# Patient Record
Sex: Female | Born: 1995 | Race: Black or African American | Hispanic: No | Marital: Single | Smoking: Never smoker
Health system: Southern US, Community
[De-identification: ages and names within clinical notes are randomized; demographics above are authoritative.]

## PROBLEM LIST (undated history)

## (undated) DIAGNOSIS — J45909 Unspecified asthma, uncomplicated: Secondary | ICD-10-CM

---
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
## 2018-12-24 ENCOUNTER — Other Ambulatory Visit: Payer: Self-pay

## 2018-12-24 ENCOUNTER — Encounter (HOSPITAL_COMMUNITY): Payer: Self-pay

## 2018-12-24 ENCOUNTER — Ambulatory Visit (INDEPENDENT_AMBULATORY_CARE_PROVIDER_SITE_OTHER): Payer: Self-pay

## 2018-12-24 ENCOUNTER — Ambulatory Visit (HOSPITAL_COMMUNITY)
Admission: EM | Admit: 2018-12-24 | Discharge: 2018-12-24 | Disposition: A | Discharge status: Home / Self Care | Payer: Self-pay | Attending: Family Medicine | Admitting: Family Medicine

## 2018-12-24 DIAGNOSIS — K5900 Constipation, unspecified: Secondary | ICD-10-CM

## 2018-12-24 MED ORDER — POLYETHYLENE GLYCOL 3350 17 GM/SCOOP PO POWD: 17.0000 g | Freq: Every day | ORAL | 0 refills | Status: AC

## 2018-12-24 NOTE — ED Provider Notes (Signed)
[facility]    CSN: [id number] Arrival date & time: 12/24/18  1042      History   Chief Complaint Chief Complaint  Patient presents with  . Abdominal Pain    HPI Gail Rogers is a 23 y.o. female.   This is the initial visit for this 23 year old woman.  She complains of abdominal pain.  Is relatively constant but does vary in intensity.  It is associated with nausea but no vomiting.  Patient's had no fever.  Patient thinks he may be constipated.  She is going to the bathroom regularly but feels somewhat bloated.  Pain started about 4 days ago.     Past Medical History:  Diagnosis Date  . Asthma     There are no problems to display for this patient.   History reviewed. No pertinent surgical history.  OB History   No obstetric history on file.      Home Medications    Prior to Admission medications   Medication Sig Start Date End Date Taking? Authorizing Provider  ibuprofen (ADVIL,MOTRIN) 600 MG tablet Take 1 tablet (600 mg total) by mouth 3 (three) times daily. 04/07/13   Fransico Meadow, PA-C  OVER THE COUNTER MEDICATION Asthma pump    [provider]  polyethylene glycol powder (MIRALAX) 17 GM/SCOOP powder Take 17 g by mouth daily. 12/24/18   Robyn Haber, MD    Family History History reviewed. No pertinent family history.  Social History Social History   Tobacco Use  . Smoking status: Never Smoker  . Smokeless tobacco: Never Used  Substance Use Topics  . Alcohol use: No  . Drug use: No     Allergies   Patient has no known allergies.   Review of Systems Review of Systems  Gastrointestinal: Positive for abdominal pain and nausea.  All other systems reviewed and are negative.    Physical Exam Triage Vital Signs ED Triage Vitals  Enc Vitals Group     BP 12/24/18 1059 137/85     Pulse Rate 12/24/18 1059 85     Resp 12/24/18 1059 16     Temp 12/24/18 1059 98.5 F (36.9 C)     Temp Source 12/24/18 1059 Oral   SpO2 12/24/18 1059 99 %     Weight 12/24/18 1056 160 lb (72.6 kg)     Height --      Head Circumference --      Peak Flow --      Pain Score 12/24/18 1056 7     Pain Loc --      Pain Edu? --      Excl. in [place]? --    No data found.  Updated Vital Signs BP 137/85 (BP Location: Right Arm)   Pulse 85   Temp 98.5 F (36.9 C) (Oral)   Resp 16   Wt 72.6 kg   LMP 12/24/2018   SpO2 99%    Physical Exam Vitals and nursing note reviewed.  Constitutional:      Appearance: She is well-developed.  HENT:     Head: Normocephalic.  Eyes:     Extraocular Movements: Extraocular movements intact.  Pulmonary:     Effort: Pulmonary effort is normal.     Breath sounds: Normal breath sounds.  Abdominal:     General: Abdomen is flat. Bowel sounds are normal.     Palpations: Abdomen is soft.     Tenderness: There is no abdominal tenderness.  Skin:    General: Skin is  warm and dry.  Neurological:     General: No focal deficit present.     Mental Status: She is alert.  Psychiatric:        Mood and Affect: Mood normal.        Behavior: Behavior normal.      UC Treatments / Results  Labs (all labs ordered are listed, but only abnormal results are displayed) Labs Reviewed - No data to display  EKG 12 lead EKG shows nonspecific T wave changes in anterior leads.  Radiology DG Abd 1 View  Result Date: 12/24/2018 CLINICAL DATA:  Abdominal pain, constipation EXAM: ABDOMEN - 1 VIEW COMPARISON:  None. FINDINGS: Small amount of stool in the ascending colon. There is no bowel dilatation to suggest obstruction. There is no evidence of pneumoperitoneum, portal venous gas or pneumatosis. There are no pathologic calcifications along the expected course of the ureters. The osseous structures are unremarkable. IMPRESSION: There is a small amount of stool in the ascending colon. Electronically Signed   By: Elige Ko   On: 12/24/2018 12:41    Procedures Procedures (including critical care  time)  Medications Ordered in UC Medications - No data to display  Initial Impression / Assessment and Plan / UC Course  I have reviewed the triage vital signs and the nursing notes.  Pertinent labs & imaging results that were available during my care of the patient were reviewed by me and considered in my medical decision making (see chart for details).    Final Clinical Impressions(s) / UC Diagnoses   Final diagnoses:  Constipation, unspecified constipation type   Discharge Instructions   None    ED Prescriptions    Medication Sig Dispense Auth. Provider   polyethylene glycol powder (MIRALAX) 17 GM/SCOOP powder Take 17 g by mouth daily. 255 g Elvina Sidle, MD     I have reviewed the PDMP during this encounter.   Elvina Sidle, MD 12/24/18 1248

## 2018-12-24 NOTE — ED Triage Notes (Signed)
Pt states she has been having cramps and nausea. That have been very serve  for 3 or 4 days. Pt states it feels like gas .

## 2020-10-09 IMAGING — DX DG ABDOMEN 1V
1 series · 1 of 1 positions shown · non-contrast
Comparison: None.

CLINICAL DATA: Abdominal pain, constipation

EXAM:
ABDOMEN - 1 VIEW

[abdomen kub]
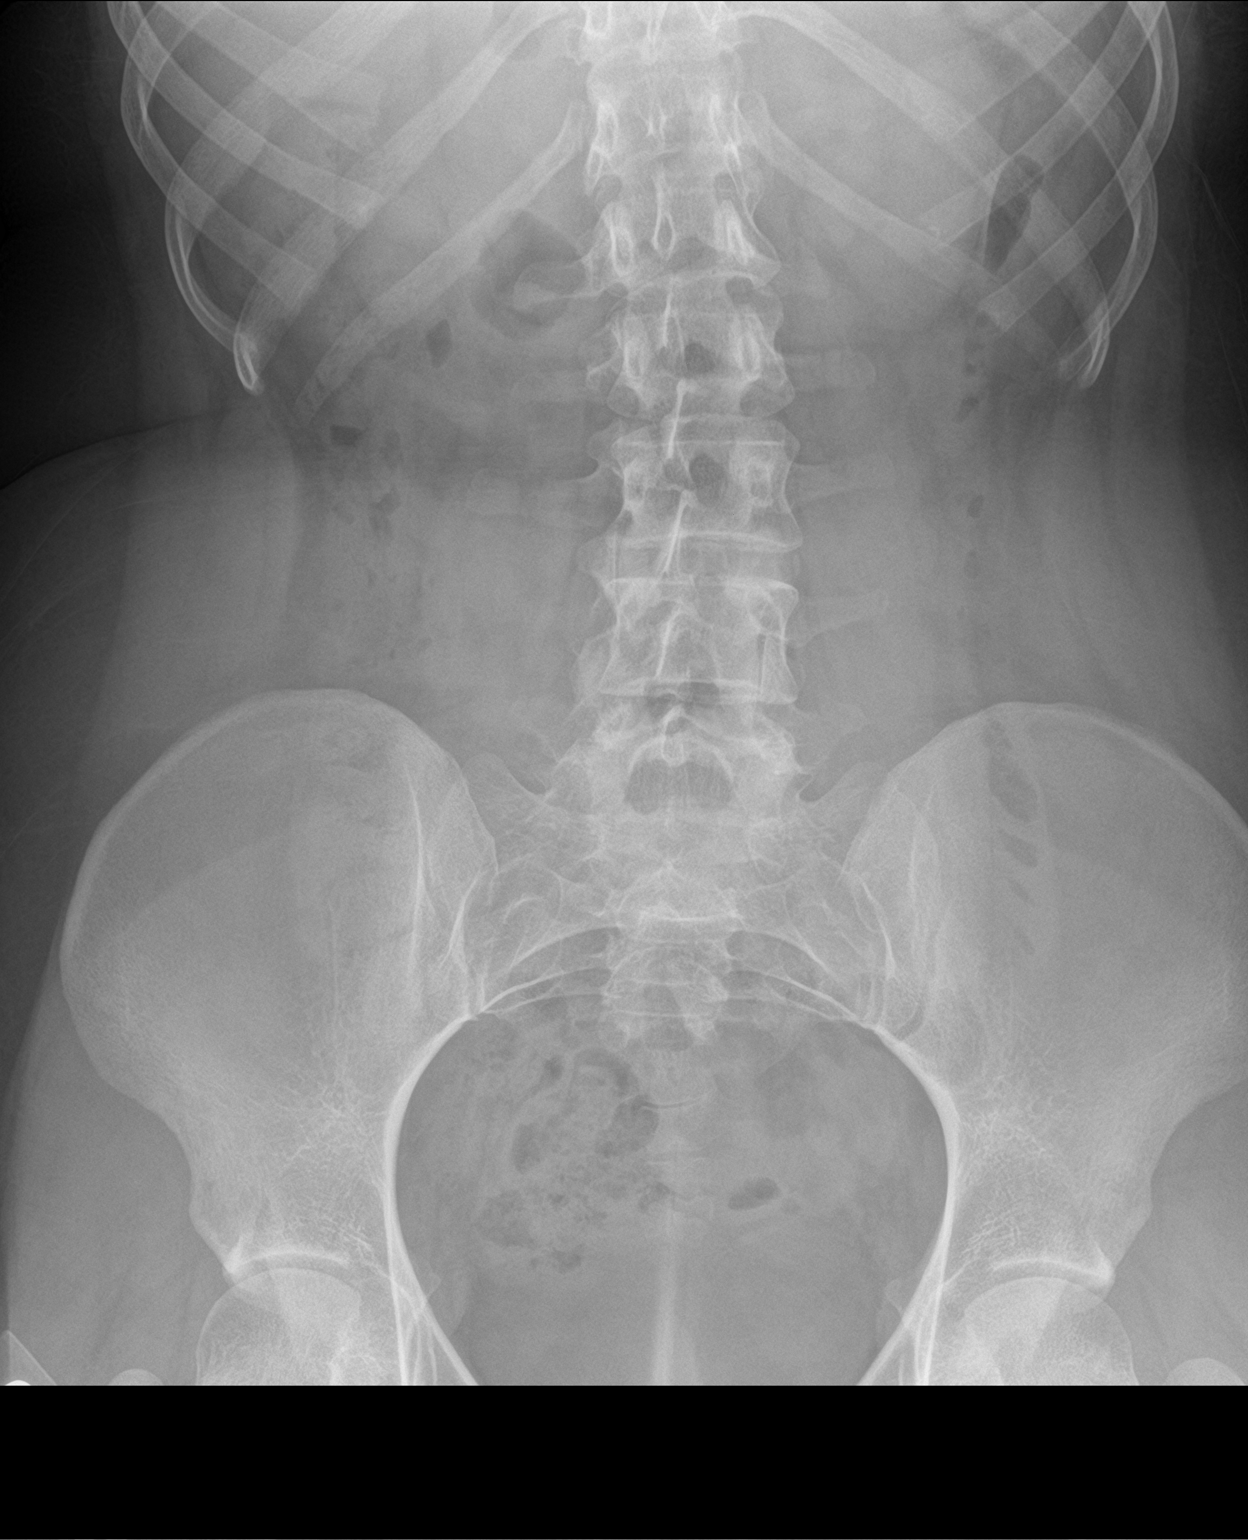

[1 of 1 positions shown; findings below may reference images not displayed]

FINDINGS: Small amount of stool in the ascending colon. There is no bowel
dilatation to suggest obstruction. There is no evidence of
pneumoperitoneum, portal venous gas or pneumatosis.

There are no pathologic calcifications along the expected course of
the ureters.

The osseous structures are unremarkable.
IMPRESSION: There is a small amount of stool in the ascending colon.
